# Patient Record
Sex: Male | Born: 1995 | Race: White | Hispanic: No | Marital: Single | Smoking: Current every day smoker
Health system: Southern US, Community
[De-identification: ages and names within clinical notes are randomized; demographics above are authoritative.]

## PROBLEM LIST (undated history)

## (undated) DIAGNOSIS — F909 Attention-deficit hyperactivity disorder, unspecified type: Secondary | ICD-10-CM

## (undated) HISTORY — DX: Attention-deficit hyperactivity disorder, unspecified type: F90.9

---
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
## 2014-02-02 ENCOUNTER — Emergency Department: Payer: Self-pay | Admitting: Emergency Medicine

## 2014-02-02 LAB — BASIC METABOLIC PANEL
Anion Gap: 7 (ref 7–16)
BUN: 11 mg/dL (ref 7–18)
CALCIUM: 8.8 mg/dL (ref 8.5–10.1)
CREATININE: 1.17 mg/dL (ref 0.60–1.30)
Chloride: 106 mmol/L (ref 98–107)
Co2: 28 mmol/L (ref 21–32)
Glucose: 84 mg/dL (ref 65–99)
Osmolality: 280 (ref 275–301)
POTASSIUM: 3.6 mmol/L (ref 3.5–5.1)
Sodium: 141 mmol/L (ref 136–145)

## 2014-02-02 LAB — CBC
HCT: 44.3 % (ref 40.0–52.0)
HGB: 15.1 g/dL (ref 13.0–18.0)
MCH: 30.2 pg (ref 26.0–34.0)
MCHC: 34 g/dL (ref 32.0–36.0)
MCV: 89 fL (ref 80–100)
Platelet: 325 10*3/uL (ref 150–440)
RBC: 4.99 10*6/uL (ref 4.40–5.90)
RDW: 12.1 % (ref 11.5–14.5)
WBC: 16.1 10*3/uL — ABNORMAL HIGH (ref 3.8–10.6)

## 2014-02-02 LAB — TROPONIN I

## 2014-12-18 ENCOUNTER — Emergency Department
Admission: EM | Admit: 2014-12-18 | Discharge: 2014-12-18 | Disposition: A | Discharge status: Home / Self Care | Payer: Self-pay | Attending: Emergency Medicine | Admitting: Emergency Medicine

## 2014-12-18 ENCOUNTER — Encounter: Payer: Self-pay | Admitting: Emergency Medicine

## 2014-12-18 DIAGNOSIS — Z720 Tobacco use: Secondary | ICD-10-CM | POA: Insufficient documentation

## 2014-12-18 DIAGNOSIS — L089 Local infection of the skin and subcutaneous tissue, unspecified: Secondary | ICD-10-CM | POA: Insufficient documentation

## 2014-12-18 DIAGNOSIS — B9689 Other specified bacterial agents as the cause of diseases classified elsewhere: Secondary | ICD-10-CM

## 2014-12-18 MED ORDER — TRAMADOL HCL 50 MG PO TABS: 50.0000 mg | ORAL_TABLET | Freq: Four times a day (QID) | ORAL | Status: DC | PRN

## 2014-12-18 MED ORDER — IBUPROFEN 800 MG PO TABS
800.0000 mg | ORAL_TABLET | Freq: Once | ORAL | Status: AC
  Administered 2014-12-18: 800 mg via ORAL
  Filled 2014-12-18: qty 1

## 2014-12-18 MED ORDER — SULFAMETHOXAZOLE-TRIMETHOPRIM 800-160 MG PO TABS: 1.0000 | ORAL_TABLET | Freq: Two times a day (BID) | ORAL | Status: DC

## 2014-12-18 MED ORDER — TRAMADOL HCL 50 MG PO TABS
50.0000 mg | ORAL_TABLET | Freq: Once | ORAL | Status: AC
  Administered 2014-12-18: 50 mg via ORAL
  Filled 2014-12-18: qty 1

## 2014-12-18 MED ORDER — IBUPROFEN 800 MG PO TABS: 800.0000 mg | ORAL_TABLET | Freq: Three times a day (TID) | ORAL | Status: DC | PRN

## 2014-12-18 MED ORDER — SULFAMETHOXAZOLE-TRIMETHOPRIM 800-160 MG PO TABS
1.0000 | ORAL_TABLET | Freq: Once | ORAL | Status: AC
  Administered 2014-12-18: 1 via ORAL
  Filled 2014-12-18: qty 1

## 2014-12-18 NOTE — ED Notes (Signed)
Small abcess on lefrt cheeek

## 2014-12-18 NOTE — ED Provider Notes (Signed)
[facility] Emergency Department Provider Note  ____________________________________________  Time seen: Approximately 1:42 PM  I have reviewed the triage vital signs and the nursing notes.   HISTORY  Chief Complaint Abscess    HPI Joel AlkenRandy K Cadena Jr. is a 19 y.o. male left facial pain and edema secondary to an infection. Patient stated week ago he was working on the roof see his a lot of scratches on his face from tree limbs. Patient states noticed swelling and redness and thought his acne was flaring. Patient tried to express material from the lesion which made it worse 2 days ago. Patient rates his pain as a 5/10. Patient denies any fever with this complaint.  History reviewed. No pertinent past medical history.  There are no active problems to display for this patient.   History reviewed. No pertinent past surgical history.  No current outpatient prescriptions on file.  Allergies Review of patient's allergies indicates no known allergies.  No family history on file.  Social History History  Substance Use Topics  . Smoking status: Current Every Day Smoker  . Smokeless tobacco: Not on file  . Alcohol Use: No    Review of Systems Constitutional: No fever/chills Eyes: No visual changes. ENT: No sore throat. Cardiovascular: Denies chest pain. Respiratory: Denies shortness of breath. Gastrointestinal: No abdominal pain.  No nausea, no vomiting.  No diarrhea.  No constipation. Genitourinary: Negative for dysuria. Musculoskeletal: Negative for back pain. Skin: Redness and swelling to the left lateral maxillary area. Neurological: Negative for headaches, focal weakness or numbness. 10-point ROS otherwise negative.  ____________________________________________   PHYSICAL EXAM:  VITAL SIGNS: ED Triage Vitals  Enc Vitals Group     BP 12/18/14 1326 115/75 mmHg     Pulse Rate 12/18/14 1326 86     Resp 12/18/14 1326 20     Temp 12/18/14  1326 98.2 F (36.8 C)     Temp Source 12/18/14 1326 Oral     SpO2 12/18/14 1326 98 %     Weight 12/18/14 1326 150 lb (68.04 kg)     Height 12/18/14 1326 5\' 7"  (1.702 m)     Head Cir --      Peak Flow --      Pain Score 12/18/14 1326 5     Pain Loc --      Pain Edu? --      Excl. in GC? --     Constitutional: Alert and oriented. Well appearing and in no acute distress. Eyes: Conjunctivae are normal. PERRL. EOMI. Head: Atraumatic. Nose: No congestion/rhinnorhea. Mouth/Throat: Mucous membranes are moist.  Oropharynx non-erythematous. Neck: No stridor. No cervical spine tenderness to palpation. Hematological/Lymphatic/Immunilogical: No cervical lymphadenopathy. Cardiovascular: Normal rate, regular rhythm. Grossly normal heart sounds.  Good peripheral circulation. Respiratory: Normal respiratory effort.  No retractions. Lungs CTAB. Gastrointestinal: Soft and nontender. No distention. No abdominal bruits. No CVA tenderness. Musculoskeletal: No lower extremity tenderness nor edema.  No joint effusions. Neurologic:  Normal speech and language. No gross focal neurologic deficits are appreciated. No gait instability. Skin:  Erythematous any tenderness nodule lesion left lateral mandible area. Lesion is nonfluctuant. Psychiatric: Mood and affect are normal. Speech and behavior are normal.  ____________________________________________   LABS (all labs ordered are listed, but only abnormal results are displayed)  Labs Reviewed - No data to display ____________________________________________  EKG   ____________________________________________  RADIOLOGY   ____________________________________________   PROCEDURES  Procedure(s) performed: None  Critical Care performed: No  ____________________________________________   INITIAL IMPRESSION /  ASSESSMENT AND PLAN / ED COURSE  Pertinent labs & imaging results that were available during my care of the patient were reviewed by me  and considered in my medical decision making (see chart for details).  Skin infection. Advised patient not to try to express material from the area. Patient l prescribe Bactrim, ibuprofen, and tramadol. Patient advised to apply warm compresses to the area twice daily as directed. Patient advised follow-up with "clinic and return to ER if his condition worsens. ____________________________________________   FINAL CLINICAL IMPRESSION(S) / ED DIAGNOSES  Final diagnoses:  Localized bacterial skin infection      Joni Reining, PA-C 12/18/14 1355  Jene Every, MD 12/18/14 [phone]

## 2014-12-18 NOTE — ED Notes (Signed)
Possible abscess area to face and some dental pain

## 2016-02-17 ENCOUNTER — Emergency Department
Admission: EM | Admit: 2016-02-17 | Discharge: 2016-02-17 | Disposition: A | Discharge status: Home / Self Care | Payer: Self-pay | Attending: Emergency Medicine | Admitting: Emergency Medicine

## 2016-02-17 DIAGNOSIS — F17200 Nicotine dependence, unspecified, uncomplicated: Secondary | ICD-10-CM | POA: Insufficient documentation

## 2016-02-17 DIAGNOSIS — K0889 Other specified disorders of teeth and supporting structures: Secondary | ICD-10-CM

## 2016-02-17 DIAGNOSIS — Z792 Long term (current) use of antibiotics: Secondary | ICD-10-CM | POA: Insufficient documentation

## 2016-02-17 DIAGNOSIS — K029 Dental caries, unspecified: Secondary | ICD-10-CM | POA: Insufficient documentation

## 2016-02-17 DIAGNOSIS — Z791 Long term (current) use of non-steroidal anti-inflammatories (NSAID): Secondary | ICD-10-CM | POA: Insufficient documentation

## 2016-02-17 MED ORDER — OXYCODONE-ACETAMINOPHEN 5-325 MG PO TABS
1.0000 | ORAL_TABLET | Freq: Once | ORAL | Status: AC
  Administered 2016-02-17: 1 via ORAL
  Filled 2016-02-17: qty 1

## 2016-02-17 MED ORDER — PENICILLIN V POTASSIUM 500 MG PO TABS: 500.0000 mg | ORAL_TABLET | Freq: Four times a day (QID) | ORAL | 0 refills | Status: DC

## 2016-02-17 MED ORDER — PENICILLIN V POTASSIUM 500 MG PO TABS
ORAL_TABLET | ORAL | Status: AC
  Administered 2016-02-17: 500 mg via ORAL
  Filled 2016-02-17: qty 1

## 2016-02-17 MED ORDER — PENICILLIN V POTASSIUM 500 MG PO TABS
500.0000 mg | ORAL_TABLET | Freq: Once | ORAL | Status: AC
  Administered 2016-02-17: 500 mg via ORAL

## 2016-02-17 MED ORDER — OXYCODONE-ACETAMINOPHEN 5-325 MG PO TABS: 1.0000 | ORAL_TABLET | Freq: Four times a day (QID) | ORAL | 0 refills | Status: DC | PRN

## 2016-02-17 MED ORDER — PENICILLIN V POTASSIUM 500 MG PO TABS
ORAL_TABLET | ORAL | Status: AC
  Filled 2016-02-17: qty 1

## 2016-02-17 NOTE — ED Triage Notes (Signed)
Pt states that he has been having pain in his mouth for the past 2.5 days

## 2016-02-17 NOTE — ED Provider Notes (Signed)
[facility] Emergency Department Provider Note  ____________________________________________   I have reviewed the triage vital signs and the nursing notes.   HISTORY  Chief Complaint Dental Pain    HPI Joel Floyd. is a 20 y.o. male presents today complaining of dental pain on the right upper. Patient has multiple decayed teeth. No fever no swelling. Pain for 2 days. Has had similar in the past. Has been told he needs to have his teeth pulled. Does not have insurance. No inciting injury.    No past medical history on file.  There are no active problems to display for this patient.   No past surgical history on file.  Prior to Admission medications   Medication Sig Start Date End Date Taking? Authorizing Provider  ibuprofen (ADVIL,MOTRIN) 800 MG tablet Take 1 tablet (800 mg total) by mouth every 8 (eight) hours as needed for moderate pain. 12/18/14   Joni Reining, PA-C  oxyCODONE-acetaminophen (ROXICET) 5-325 MG tablet Take 1 tablet by mouth every 6 (six) hours as needed. 02/17/16   Jeanmarie Plant, MD  penicillin v potassium (VEETID) 500 MG tablet Take 1 tablet (500 mg total) by mouth 4 (four) times daily. 02/17/16   Jeanmarie Plant, MD  sulfamethoxazole-trimethoprim (BACTRIM DS,SEPTRA DS) 800-160 MG per tablet Take 1 tablet by mouth 2 (two) times daily. 12/18/14   Joni Reining, PA-C  traMADol (ULTRAM) 50 MG tablet Take 1 tablet (50 mg total) by mouth every 6 (six) hours as needed for moderate pain. 12/18/14   Joni Reining, PA-C    Allergies Review of patient's allergies indicates no known allergies.  No family history on file.  Social History Social History  Substance Use Topics  . Smoking status: Current Every Day Smoker  . Smokeless tobacco: Not on file  . Alcohol use No    Review of Systems  ____________________________________________   PHYSICAL EXAM:  VITAL SIGNS: ED Triage Vitals  Enc Vitals Group     BP 02/17/16  2039 129/84     Pulse Rate 02/17/16 2039 84     Resp 02/17/16 2039 18     Temp 02/17/16 2039 98.7 F (37.1 C)     Temp Source 02/17/16 2039 Oral     SpO2 02/17/16 2039 100 %     Weight 02/17/16 2040 130 lb (59 kg)     Height 02/17/16 2040 5\' 7"  (1.702 m)     Head Circumference --      Peak Flow --      Pain Score 02/17/16 2040 8     Pain Loc --      Pain Edu? --      Excl. in GC? --     Constitutional: Alert and oriented. Well appearing and in no acute distress. Eyes: Conjunctivae are normal. PERRL. EOMI. Head: Atraumatic. Nose: No congestion/rhinnorhea. Mouth/Throat: Mucous membranes are moist.  Oropharynx non-erythematous., Multiple decayed teeth noted in the right upper jaw. No evidence of abscess not swollen or erythematous. There is some tenderness to palpation above the gumline however. No fluctuance. No evidence of ludwigs angina. Oropharynx is clear with no significant swelling no stridor no posterior pharyngeal swelling, there is no cervical lymphadenopathy.  Skin:  Skin is warm, dry and intact. No rash noted. Psychiatric: Mood and affect are normal. Speech and behavior are normal.  ____________________________________________   LABS (all labs ordered are listed, but only abnormal results are displayed)  Labs Reviewed - No data to display ____________________________________________  EKG  I personally interpreted any EKGs ordered by me or triage  ____________________________________________  RADIOLOGY  I reviewed any imaging ordered by me or triage that were performed during my shift and, if possible, patient and/or family made aware of any abnormal findings. ____________________________________________   PROCEDURES  Procedure(s) performed: None  Procedures  Critical Care performed: None  ____________________________________________   INITIAL IMPRESSION / ASSESSMENT AND PLAN / ED COURSE  Pertinent labs & imaging results that were available during my  care of the patient were reviewed by me and considered in my medical decision making (see chart for details).  Patient with dental pain, because he has some gumline pain I'll also give him penicillin. He is not allergic to state. The given a short course of pain medication but I strongly advised that this is not appropriate venue for ongoing dental issues and that he needs to follow-up with the dental clinic and I have given him information about that.  Clinical Course   ____________________________________________   FINAL CLINICAL IMPRESSION(S) / ED DIAGNOSES  Final diagnoses:  Pain, dental  Pain due to dental caries      This chart was dictated using voice recognition software.  Despite best efforts to proofread,  errors can occur which can change meaning.      Jeanmarie PlantJames A Junetta Hearn, MD 02/17/16 2100

## 2016-11-05 ENCOUNTER — Emergency Department: Payer: Self-pay

## 2016-11-05 ENCOUNTER — Emergency Department
Admission: EM | Admit: 2016-11-05 | Discharge: 2016-11-05 | Disposition: A | Discharge status: Home / Self Care | Payer: Self-pay | Attending: Emergency Medicine | Admitting: Emergency Medicine

## 2016-11-05 ENCOUNTER — Encounter: Payer: Self-pay | Admitting: Emergency Medicine

## 2016-11-05 DIAGNOSIS — Z791 Long term (current) use of non-steroidal anti-inflammatories (NSAID): Secondary | ICD-10-CM | POA: Insufficient documentation

## 2016-11-05 DIAGNOSIS — Z79899 Other long term (current) drug therapy: Secondary | ICD-10-CM | POA: Insufficient documentation

## 2016-11-05 DIAGNOSIS — J029 Acute pharyngitis, unspecified: Secondary | ICD-10-CM | POA: Insufficient documentation

## 2016-11-05 DIAGNOSIS — F17200 Nicotine dependence, unspecified, uncomplicated: Secondary | ICD-10-CM | POA: Insufficient documentation

## 2016-11-05 DIAGNOSIS — J209 Acute bronchitis, unspecified: Secondary | ICD-10-CM | POA: Insufficient documentation

## 2016-11-05 MED ORDER — AZITHROMYCIN 250 MG PO TABS: ORAL_TABLET | ORAL | 0 refills | Status: AC

## 2016-11-05 MED ORDER — BENZONATATE 100 MG PO CAPS: 100.0000 mg | ORAL_CAPSULE | Freq: Three times a day (TID) | ORAL | 0 refills | Status: DC | PRN

## 2016-11-05 MED ORDER — ALBUTEROL SULFATE HFA 108 (90 BASE) MCG/ACT IN AERS: 2.0000 | INHALATION_SPRAY | Freq: Four times a day (QID) | RESPIRATORY_TRACT | 0 refills | Status: DC | PRN

## 2016-11-05 MED ORDER — ACETAMINOPHEN 325 MG PO TABS
650.0000 mg | ORAL_TABLET | Freq: Once | ORAL | Status: AC | PRN
  Administered 2016-11-05: 650 mg via ORAL
  Filled 2016-11-05: qty 2

## 2016-11-05 MED ORDER — AZITHROMYCIN 500 MG PO TABS
500.0000 mg | ORAL_TABLET | Freq: Once | ORAL | Status: AC
  Administered 2016-11-05: 500 mg via ORAL
  Filled 2016-11-05: qty 1

## 2016-11-05 MED ORDER — HYDROCOD POLST-CPM POLST ER 10-8 MG/5ML PO SUER
5.0000 mL | Freq: Once | ORAL | Status: AC
  Administered 2016-11-05: 5 mL via ORAL
  Filled 2016-11-05: qty 5

## 2016-11-05 NOTE — ED Notes (Signed)
Pt returned from XR at this time. 

## 2016-11-05 NOTE — ED Triage Notes (Signed)
Patient ambulatory to triage with steady gait, without difficulty or distress noted; pt reports awoke Sunday with general HA, earache, sore throat, congestion, prod cough green/yellow sputum

## 2016-11-05 NOTE — ED Notes (Signed)
PT to XR at this time.

## 2016-11-05 NOTE — ED Notes (Signed)

## 2016-11-05 NOTE — ED Provider Notes (Signed)
[facility] Emergency Department Provider Note    First MD Initiated Contact with Patient 11/05/16 0503     (approximate)  I have reviewed the triage vital signs and the nursing notes.   HISTORY  Chief Complaint Headache and Nasal Congestion   HPI Joel AlkenRandy K Whiston Jr. is a 21 y.o. male with no chronic past medical conditions presents to the emergency department with 2 day history of generalized headache bilateral earaches sore throat nasal congestion and productive cough with greenish yellow sputum. Patient afebrile on presentation with temperature 100.5. Patient admits to daily tobacco use one pack of cigarettes per day.   Past medical history None There are no active problems to display for this patient.   Past surgical history None  Prior to Admission medications   Medication Sig Start Date End Date Taking? Authorizing Provider  albuterol (PROVENTIL HFA;VENTOLIN HFA) 108 (90 Base) MCG/ACT inhaler Inhale 2 puffs into the lungs every 6 (six) hours as needed for wheezing or shortness of breath. 11/05/16   Darci CurrentBrown, [place] N, MD  azithromycin (ZITHROMAX Z-PAK) 250 MG tablet Take 2 tablets (500 mg) on  Day 1,  followed by 1 tablet (250 mg) once daily on Days 2 through 5. 11/05/16 11/10/16  Darci CurrentBrown, Guy N, MD  benzonatate (TESSALON PERLES) 100 MG capsule Take 1 capsule (100 mg total) by mouth 3 (three) times daily as needed for cough. 11/05/16   Darci CurrentBrown, [place] N, MD  ibuprofen (ADVIL,MOTRIN) 800 MG tablet Take 1 tablet (800 mg total) by mouth every 8 (eight) hours as needed for moderate pain. 12/18/14   Joni ReiningSmith, Ronald K, PA-C  oxyCODONE-acetaminophen (ROXICET) 5-325 MG tablet Take 1 tablet by mouth every 6 (six) hours as needed. 02/17/16   Jeanmarie PlantMcShane, James A, MD  penicillin v potassium (VEETID) 500 MG tablet Take 1 tablet (500 mg total) by mouth 4 (four) times daily. 02/17/16   Jeanmarie PlantMcShane, James A, MD  sulfamethoxazole-trimethoprim (BACTRIM DS,SEPTRA DS) 800-160 MG per  tablet Take 1 tablet by mouth 2 (two) times daily. 12/18/14   Joni ReiningSmith, Ronald K, PA-C  traMADol (ULTRAM) 50 MG tablet Take 1 tablet (50 mg total) by mouth every 6 (six) hours as needed for moderate pain. 12/18/14   Joni ReiningSmith, Ronald K, PA-C    Allergies No known drug allergies No family history on file.  Social History Social History  Substance Use Topics  . Smoking status: Current Every Day Smoker  . Smokeless tobacco: Never Used  . Alcohol use No    Review of Systems Constitutional: Positive for fever/chills Eyes: No visual changes. ENT: Positive for sore throat. Cardiovascular: Denies chest pain. Respiratory: Denies shortness of breath.Positive productive cough Gastrointestinal: No abdominal pain.  No nausea, no vomiting.  No diarrhea.  No constipation. Genitourinary: Negative for dysuria. Musculoskeletal: Negative for neck pain.  Negative for back pain. Integumentary: Negative for rash. Neurological: Positive for headaches, negative for focal weakness or numbness.   ____________________________________________   PHYSICAL EXAM:  VITAL SIGNS: ED Triage Vitals  Enc Vitals Group     BP 11/05/16 0446 119/74     Pulse Rate 11/05/16 0446 92     Resp 11/05/16 0446 18     Temp 11/05/16 0446 (!) 100.5 F (38.1 C)     Temp Source 11/05/16 0446 Oral     SpO2 11/05/16 0446 99 %     Weight 11/05/16 0444 58.5 kg (129 lb)     Height 11/05/16 0444 1.702 m (5\' 7" )     Head Circumference --  Peak Flow --      Pain Score 11/05/16 0444 6     Pain Loc --      Pain Edu? --      Excl. in GC? --     Constitutional: Alert and oriented. Well appearing and in no acute distress. Eyes: Conjunctivae are normal.  Head: Atraumatic. Ears:  Healthy appearing ear canals and TMs bilaterally Nose: No congestion/rhinnorhea. Mouth/Throat: Mucous membranes are moist.  Oropharynx non-erythematous. Neck: No stridor.  No meningeal signs.   Cardiovascular: Normal rate, regular rhythm. Good peripheral  circulation. Grossly normal heart sounds. Respiratory: Normal respiratory effort.  No retractions. Lungs CTAB. Gastrointestinal: Soft and nontender. No distention.  Musculoskeletal: No lower extremity tenderness nor edema. No gross deformities of extremities. Neurologic:  Normal speech and language. No gross focal neurologic deficits are appreciated.  Skin:  Skin is warm, dry and intact. No rash noted. Psychiatric: Mood and affect are normal. Speech and behavior are normal.   RADIOLOGY I, [place] N BROWN, personally viewed and evaluated these images (plain radiographs) as part of my medical decision making, as well as reviewing the written report by the radiologist.  Dg Chest 2 View  Result Date: 11/05/2016 CLINICAL DATA:  Productive cough.  Fever. EXAM: CHEST  2 VIEW COMPARISON:  02/02/2014 FINDINGS: The cardiomediastinal contours are normal. The lungs are clear. Pulmonary vasculature is normal. No consolidation, pleural effusion, or pneumothorax. No acute osseous abnormalities are seen. IMPRESSION: No acute abnormality. Electronically Signed   By: Rubye Oaks M.D.   On: 11/05/2016 05:52      Procedures   ____________________________________________   INITIAL IMPRESSION / ASSESSMENT AND PLAN / ED COURSE  Pertinent labs & imaging results that were available during my care of the patient were reviewed by me and considered in my medical decision making (see chart for details).  21 year old male presenting with symptoms consistent with acute bronchitis. Chest x-ray revealed no evidence of pneumonia. She was a daily one pack per day cigarette smoker and a such concern for possible bacterial etiology. Patient given azithromycin and Tessalon and albuterol inhaler for home      ____________________________________________  FINAL CLINICAL IMPRESSION(S) / ED DIAGNOSES  Final diagnoses:  Acute bronchitis, unspecified organism     MEDICATIONS GIVEN DURING THIS  VISIT:  Medications  acetaminophen (TYLENOL) tablet 650 mg (650 mg Oral Given 11/05/16 0449)  chlorpheniramine-HYDROcodone (TUSSIONEX) 10-8 MG/5ML suspension 5 mL (5 mLs Oral Given 11/05/16 0537)  azithromycin (ZITHROMAX) tablet 500 mg (500 mg Oral Given 11/05/16 0537)     NEW OUTPATIENT MEDICATIONS STARTED DURING THIS VISIT:  Discharge Medication List as of 11/05/2016  6:16 AM    START taking these medications   Details  albuterol (PROVENTIL HFA;VENTOLIN HFA) 108 (90 Base) MCG/ACT inhaler Inhale 2 puffs into the lungs every 6 (six) hours as needed for wheezing or shortness of breath., Starting Wed 11/05/2016, Print    azithromycin (ZITHROMAX Z-PAK) 250 MG tablet Take 2 tablets (500 mg) on  Day 1,  followed by 1 tablet (250 mg) once daily on Days 2 through 5., Print    benzonatate (TESSALON PERLES) 100 MG capsule Take 1 capsule (100 mg total) by mouth 3 (three) times daily as needed for cough., Starting Wed 11/05/2016, Print        Discharge Medication List as of 11/05/2016  6:16 AM      Discharge Medication List as of 11/05/2016  6:16 AM       Note:  This document was prepared using Dragon  voice recognition software and may include unintentional dictation errors.    Darci Current, MD 11/05/16 2227

## 2016-11-08 ENCOUNTER — Encounter: Payer: Self-pay | Admitting: Emergency Medicine

## 2016-11-08 ENCOUNTER — Emergency Department
Admission: EM | Admit: 2016-11-08 | Discharge: 2016-11-08 | Disposition: A | Discharge status: Home / Self Care | Payer: No Typology Code available for payment source | Attending: Emergency Medicine | Admitting: Emergency Medicine

## 2016-11-08 DIAGNOSIS — B9789 Other viral agents as the cause of diseases classified elsewhere: Secondary | ICD-10-CM | POA: Diagnosis not present

## 2016-11-08 DIAGNOSIS — Z79899 Other long term (current) drug therapy: Secondary | ICD-10-CM | POA: Diagnosis not present

## 2016-11-08 DIAGNOSIS — J069 Acute upper respiratory infection, unspecified: Secondary | ICD-10-CM | POA: Diagnosis not present

## 2016-11-08 DIAGNOSIS — F17200 Nicotine dependence, unspecified, uncomplicated: Secondary | ICD-10-CM | POA: Insufficient documentation

## 2016-11-08 DIAGNOSIS — R05 Cough: Secondary | ICD-10-CM | POA: Diagnosis present

## 2016-11-08 MED ORDER — IPRATROPIUM-ALBUTEROL 0.5-2.5 (3) MG/3ML IN SOLN
3.0000 mL | Freq: Once | RESPIRATORY_TRACT | Status: AC
  Administered 2016-11-08: 3 mL via RESPIRATORY_TRACT
  Filled 2016-11-08: qty 3

## 2016-11-08 MED ORDER — PREDNISONE 10 MG (21) PO TBPK: ORAL_TABLET | ORAL | 0 refills | Status: DC

## 2016-11-08 MED ORDER — SODIUM CHLORIDE 0.9 % IV BOLUS (SEPSIS)
1000.0000 mL | Freq: Once | INTRAVENOUS | Status: AC
  Administered 2016-11-08: 1000 mL via INTRAVENOUS

## 2016-11-08 NOTE — ED Provider Notes (Signed)
[facility] Emergency Department Provider Note  ____________________________________________  Time seen: Approximately 10:49 AM  I have reviewed the triage vital signs and the nursing notes.   HISTORY  Chief Complaint Cough    HPI Joel Floyd. is a 21 y.o. male that presents to the emergency department with 5 days of generalized body aches, nasal congestion, and productive cough with green sputum. His chest hurts when he coughs. Patient states that he has not eaten or had anything to drink today. He was seen in the emergency department 3 days ago for the same symptoms. Symptoms have not gotten better or worse. He has been using the inhaler he was prescribed but did not read the label so was using it more often than prescribed. He thought he was overdosing on his inhaler so he quit using it yesterday. He has been taking the [employer] and the antibiotics he was prescribed in the ED without relief. He smokes a pack of cigarettes per day. He denies fever, SOB, vomiting, abdominal pain.   History reviewed. No pertinent past medical history.  There are no active problems to display for this patient.   History reviewed. No pertinent surgical history.  Prior to Admission medications   Medication Sig Start Date End Date Taking? Authorizing Provider  albuterol (PROVENTIL HFA;VENTOLIN HFA) 108 (90 Base) MCG/ACT inhaler Inhale 2 puffs into the lungs every 6 (six) hours as needed for wheezing or shortness of breath. 11/05/16   Darci Current, MD  azithromycin (ZITHROMAX Z-PAK) 250 MG tablet Take 2 tablets (500 mg) on  Day 1,  followed by 1 tablet (250 mg) once daily on Days 2 through 5. 11/05/16 11/10/16  Darci Current, MD  benzonatate (TESSALON PERLES) 100 MG capsule Take 1 capsule (100 mg total) by mouth 3 (three) times daily as needed for cough. 11/05/16   Darci Current, MD  ibuprofen (ADVIL,MOTRIN) 800 MG tablet Take 1 tablet (800 mg total) by mouth  every 8 (eight) hours as needed for moderate pain. 12/18/14   Joni Reining, PA-C  oxyCODONE-acetaminophen (ROXICET) 5-325 MG tablet Take 1 tablet by mouth every 6 (six) hours as needed. 02/17/16   Jeanmarie Plant, MD  penicillin v potassium (VEETID) 500 MG tablet Take 1 tablet (500 mg total) by mouth 4 (four) times daily. 02/17/16   Jeanmarie Plant, MD  predniSONE (STERAPRED UNI-PAK 21 TAB) 10 MG (21) TBPK tablet Take 6 tablets on day 1, take 5 tablets on day 2, take 4 tablets on day 3, take 3 tablets on day 4, take 2 tablets on day 5, take 1 tablet on day 6 11/08/16   Enid Derry, PA-C  sulfamethoxazole-trimethoprim (BACTRIM DS,SEPTRA DS) 800-160 MG per tablet Take 1 tablet by mouth 2 (two) times daily. 12/18/14   Joni Reining, PA-C  traMADol (ULTRAM) 50 MG tablet Take 1 tablet (50 mg total) by mouth every 6 (six) hours as needed for moderate pain. 12/18/14   Joni Reining, PA-C    Allergies Patient has no known allergies.  History reviewed. No pertinent family history.  Social History Social History  Substance Use Topics  . Smoking status: Current Every Day Smoker  . Smokeless tobacco: Never Used  . Alcohol use No     Review of Systems  Constitutional: No fever/chills Eyes: No visual changes. No discharge. ENT: Positive for congestion and rhinorrhea. Respiratory: Positive for cough. No SOB. Gastrointestinal: No abdominal pain.  No nausea, no vomiting.  No diarrhea.  No constipation. Skin: Negative for rash, abrasions, lacerations, ecchymosis. Neurological: Negative for headaches.   ____________________________________________   PHYSICAL EXAM:  VITAL SIGNS: ED Triage Vitals  Enc Vitals Group     BP 11/08/16 0950 124/66     Pulse Rate 11/08/16 0950 93     Resp 11/08/16 0950 18     Temp 11/08/16 0950 98.4 F (36.9 C)     Temp Source 11/08/16 0950 Oral     SpO2 11/08/16 0950 100 %     Weight 11/08/16 0950 129 lb (58.5 kg)     Height 11/08/16 0950 5\' 7"  (1.702 m)      Head Circumference --      Peak Flow --      Pain Score 11/08/16 0953 8     Pain Loc --      Pain Edu? --      Excl. in GC? --      Constitutional: Alert and oriented. Well appearing and in no acute distress. Eyes: Conjunctivae are normal. PERRL. EOMI. No discharge. Head: Atraumatic. ENT: No frontal and maxillary sinus tenderness.      Ears: Tympanic membranes pearly gray with good landmarks. No discharge.      Nose: Mild congestion/rhinnorhea.      Mouth/Throat: Mucous membranes are moist. Oropharynx non-erythematous. Tonsils not enlarged. No exudates. Uvula midline. Neck: No stridor.   Hematological/Lymphatic/Immunilogical: No cervical lymphadenopathy. Cardiovascular: Normal rate, regular rhythm.  Good peripheral circulation. Respiratory: Normal respiratory effort without tachypnea or retractions. Lungs CTAB. Good air entry to the bases with no decreased or absent breath sounds. Gastrointestinal: Bowel sounds 4 quadrants. Soft and nontender to palpation. No guarding or rigidity. No palpable masses. No distention. Musculoskeletal: Full range of motion to all extremities. No gross deformities appreciated. Neurologic:  Normal speech and language. No gross focal neurologic deficits are appreciated.  Skin:  Skin is warm, dry and intact. No rash noted.   ____________________________________________   LABS (all labs ordered are listed, but only abnormal results are displayed)  Labs Reviewed - No data to display ____________________________________________  EKG   ____________________________________________  RADIOLOGY  No results found.  ____________________________________________    PROCEDURES  Procedure(s) performed:    Procedures   Medications  sodium chloride 0.9 % bolus 1,000 mL (0 mLs Intravenous Stopped 11/08/16 1234)  ipratropium-albuterol (DUONEB) 0.5-2.5 (3) MG/3ML nebulizer solution 3 mL (3 mLs Nebulization Given 11/08/16 1112)      ____________________________________________   INITIAL IMPRESSION / ASSESSMENT AND PLAN / ED COURSE  Pertinent labs & imaging results that were available during my care of the patient were reviewed by me and considered in my medical decision making (see chart for details).  Review of the [place] CSRS was performed in accordance of the [facility] prior to dispensing any controlled drugs.   Patient's diagnosis is consistent with upper respiratory infection. Vital signs and exam are reassuring. He had a chest x-ray completed 2 days ago and did not show signs of pneumonia. He has been taking azithromycin daily for 3 days. I do not think there is need for a repeat chest xray at this time. Patient is in the room drinking Dr Reino KentPepper. Patient feels comfortable going home. He was given IV fluids. He felt better after DuoNeb. Patient will be discharged home with prescriptions for prednisone. Patient is to follow up with PCP as needed or otherwise directed. Patient is given ED precautions to return to the ED for any worsening or new symptoms.     ____________________________________________  FINAL  CLINICAL IMPRESSION(S) / ED DIAGNOSES  Final diagnoses:  Viral URI with cough      NEW MEDICATIONS STARTED DURING THIS VISIT:  Discharge Medication List as of 11/08/2016 12:27 PM    START taking these medications   Details  predniSONE (STERAPRED UNI-PAK 21 TAB) 10 MG (21) TBPK tablet Take 6 tablets on day 1, take 5 tablets on day 2, take 4 tablets on day 3, take 3 tablets on day 4, take 2 tablets on day 5, take 1 tablet on day 6, Print            This chart was dictated using voice recognition software/Dragon. Despite best efforts to proofread, errors can occur which can change the meaning. Any change was purely unintentional.    Enid Derry, PA-C 11/08/16 1624    Governor Rooks, MD 11/09/16 [phone]

## 2016-11-08 NOTE — ED Notes (Signed)
PA Ashley at bedside  

## 2016-11-08 NOTE — ED Triage Notes (Signed)
Pt to ed with c/o cough and congestion x several days.  Pt states was seen here about 2 days ago for same and has been taking meds as prescribed without relief.  Pt reports increased pain with cough.

## 2019-04-08 ENCOUNTER — Emergency Department: Payer: 59

## 2019-04-08 ENCOUNTER — Other Ambulatory Visit: Payer: Self-pay

## 2019-04-08 ENCOUNTER — Emergency Department
Admission: EM | Admit: 2019-04-08 | Discharge: 2019-04-08 | Disposition: A | Discharge status: Home / Self Care | Payer: 59 | Attending: Emergency Medicine | Admitting: Emergency Medicine

## 2019-04-08 DIAGNOSIS — Y990 Civilian activity done for income or pay: Secondary | ICD-10-CM | POA: Insufficient documentation

## 2019-04-08 DIAGNOSIS — X500XXA Overexertion from strenuous movement or load, initial encounter: Secondary | ICD-10-CM | POA: Diagnosis not present

## 2019-04-08 DIAGNOSIS — S3992XA Unspecified injury of lower back, initial encounter: Secondary | ICD-10-CM | POA: Diagnosis present

## 2019-04-08 DIAGNOSIS — Y9259 Other trade areas as the place of occurrence of the external cause: Secondary | ICD-10-CM | POA: Insufficient documentation

## 2019-04-08 DIAGNOSIS — S39012A Strain of muscle, fascia and tendon of lower back, initial encounter: Secondary | ICD-10-CM | POA: Diagnosis not present

## 2019-04-08 DIAGNOSIS — F17210 Nicotine dependence, cigarettes, uncomplicated: Secondary | ICD-10-CM | POA: Diagnosis not present

## 2019-04-08 DIAGNOSIS — Y9389 Activity, other specified: Secondary | ICD-10-CM | POA: Diagnosis not present

## 2019-04-08 MED ORDER — HYDROMORPHONE HCL 1 MG/ML IJ SOLN
1.0000 mg | Freq: Once | INTRAMUSCULAR | Status: AC
  Administered 2019-04-08: 1 mg via INTRAMUSCULAR
  Filled 2019-04-08: qty 1

## 2019-04-08 MED ORDER — ORPHENADRINE CITRATE 30 MG/ML IJ SOLN
60.0000 mg | Freq: Two times a day (BID) | INTRAMUSCULAR | Status: DC
  Administered 2019-04-08: 60 mg via INTRAMUSCULAR
  Filled 2019-04-08: qty 2

## 2019-04-08 MED ORDER — IBUPROFEN 600 MG PO TABS: 600.0000 mg | ORAL_TABLET | Freq: Three times a day (TID) | ORAL | 0 refills | Status: DC | PRN

## 2019-04-08 MED ORDER — TRAMADOL HCL 50 MG PO TABS: 50.0000 mg | ORAL_TABLET | Freq: Four times a day (QID) | ORAL | 0 refills | Status: DC | PRN

## 2019-04-08 MED ORDER — CYCLOBENZAPRINE HCL 10 MG PO TABS: 10.0000 mg | ORAL_TABLET | Freq: Three times a day (TID) | ORAL | 0 refills | Status: DC | PRN

## 2019-04-08 MED ORDER — ONDANSETRON 8 MG PO TBDP
8.0000 mg | ORAL_TABLET | Freq: Once | ORAL | Status: AC
  Administered 2019-04-08: 8 mg via ORAL
  Filled 2019-04-08: qty 1

## 2019-04-08 NOTE — ED Notes (Signed)
See triage note  Presents with lower back pain   States pain started after lifting heavy things at work  States pain is mainly at lower back  Non radiating    States he has used Lidoderm patches,ice ,and heat  Ambulates slowly to treatment room

## 2019-04-08 NOTE — ED Provider Notes (Signed)
[facility] Emergency Department Provider Note   ____________________________________________   First MD Initiated Contact with Patient 04/08/19 [phone]     (approximate)  I have reviewed the triage vital signs and the nursing notes.   HISTORY  Chief Complaint Back Pain    HPI Joel Floyd. is a 23 y.o. male patient complain of back pain for 3 days.  Patient states he has been performing repetitive heavy lifting for a week.  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  Patient denies radicular component to his pain.  Patient denies bladder or bowel dysfunction.  Patient no relief with over-the-counter anti-inflammatory medication and Lidoderm patches.  Patient also use a TENS unit.  Patient rates the pain as a 7/10.  Patient described the pain as achy".  Patient the pain increased with trying to stand erect.         No past medical history on file.  There are no active problems to display for this patient.   No past surgical history on file.  Prior to Admission medications   Medication Sig Start Date End Date Taking? Authorizing Provider  cyclobenzaprine (FLEXERIL) 10 MG tablet Take 1 tablet (10 mg total) by mouth 3 (three) times daily as needed. 04/08/19   Joni Reining, PA-C  ibuprofen (ADVIL) 600 MG tablet Take 1 tablet (600 mg total) by mouth every 8 (eight) hours as needed. 04/08/19   Joni Reining, PA-C  traMADol (ULTRAM) 50 MG tablet Take 1 tablet (50 mg total) by mouth every 6 (six) hours as needed for moderate pain. 04/08/19   Joni Reining, PA-C    Allergies Patient has no known allergies.  No family history on file.  Social History Social History   Tobacco Use  . Smoking status: Current Every Day Smoker  . Smokeless tobacco: Never Used  Substance Use Topics  . Alcohol use: No  . Drug use: No    Review of Systems Constitutional: No fever/chills Eyes: No  visual changes. ENT: No sore throat. Cardiovascular: Denies chest pain. Respiratory: Denies shortness of breath. Gastrointestinal: No abdominal pain.  No nausea, no vomiting.  No diarrhea.  No constipation. Genitourinary: Negative for dysuria. Musculoskeletal: Positive for back pain. Skin: Negative for rash. Neurological: Negative for headaches, focal weakness or numbness.   ____________________________________________   PHYSICAL EXAM:  VITAL SIGNS: ED Triage Vitals [04/08/19 0830]  Enc Vitals Group     BP (!) 143/88     Pulse Rate (!) 109     Resp 16     Temp 98.4 F (36.9 C)     Temp Source Oral     SpO2 100 %     Weight 129 lb (58.5 kg)     Height 5\' 8"  (1.727 m)     Head Circumference      Peak Flow      Pain Score 7     Pain Loc      Pain Edu?      Excl. in GC?    Constitutional: Alert and oriented. Well appearing and in no acute distress. Cardiovascular: Tachycardic, regular rhythm. Grossly normal heart sounds.  Good peripheral circulation. Respiratory: Normal respiratory effort.  No retractions. Lungs CTAB. Gastrointestinal: Soft and nontender. No distention. No abdominal bruits. No CVA tenderness. Musculoskeletal: Patient lumbar spine is in a slightly flexed position.  Patient has moderate guarding palpation of L3-S1.  Patient has right paraspinal muscle spasm with left  lateral movements.  No lower extremity tenderness nor edema.  No joint effusions. Neurologic:  Normal speech and language. No gross focal neurologic deficits are appreciated. No gait instability. Skin:  Skin is warm, dry and intact. No rash noted. Psychiatric: Mood and affect are normal. Speech and behavior are normal.  ____________________________________________   LABS (all labs ordered are listed, but only abnormal results are displayed)  Labs Reviewed - No data to display ____________________________________________  EKG   ____________________________________________  RADIOLOGY   ED MD interpretation:    Official radiology report(s): Dg Lumbar Spine 2-3 Views  Result Date: 04/08/2019 CLINICAL DATA:  Low back pain following an injury 2 days ago. EXAM: LUMBAR SPINE - 2-3 VIEW COMPARISON:  None. FINDINGS: Five non-rib-bearing lumbar vertebrae. Minimal levoconvex lumbar scoliosis. Unfused right L1 transverse process. No fractures, pars defects or subluxations. IMPRESSION: No acute abnormality. Electronically Signed   By: Claudie Revering M.D.   On: 04/08/2019 09:47    ____________________________________________   PROCEDURES  Procedure(s) performed (including Critical Care):  Procedures   ____________________________________________   INITIAL IMPRESSION / ASSESSMENT AND PLAN / ED COURSE  As part of my medical decision making, I reviewed the following data within the [place]. was evaluated in Emergency Department on 04/08/2019 for the symptoms described in the history of present illness. He was evaluated in the context of the global COVID-19 pandemic, which necessitated consideration that the patient might be at risk for infection with the SARS-CoV-2 virus that causes COVID-19. Institutional protocols and algorithms that pertain to the evaluation of patients at risk for COVID-19 are in a state of rapid change based on information released by regulatory bodies including the CDC and federal and state organizations. These policies and algorithms were followed during the patient's care in the ED.  Patient presents with increasing low back pain secondary to a few days of repetitive heavy lifting.  Discussed x-ray findings with patient.  Physical exam consistent with lumbar strain.  Patient given discharge care instruction work note.  Patient given prescription for tramadol, Flexeril, and ibuprofen.  Patient advised follow-up [facility] if condition persist.      ____________________________________________    FINAL CLINICAL IMPRESSION(S) / ED DIAGNOSES  Final diagnoses:  Strain of lumbar region, initial encounter     ED Discharge Orders         Ordered    traMADol (ULTRAM) 50 MG tablet  Every 6 hours PRN     04/08/19 0958    cyclobenzaprine (FLEXERIL) 10 MG tablet  3 times daily PRN     04/08/19 0958    ibuprofen (ADVIL) 600 MG tablet  Every 8 hours PRN     04/08/19 0958           Note:  This document was prepared using Dragon voice recognition software and may include unintentional dictation errors.    Sable Feil, PA-C 04/08/19 8841    Earleen Newport, MD 04/08/19 1030

## 2019-04-08 NOTE — ED Triage Notes (Signed)
Pt states that he injured his back working a few days ago, pt is ambulatory but appears strained to stand straight, states that he got some lidoderm patches, tens units, and heat and cold to help with pain and states he isn't getting any relief. Pt denies wanting to do worker's comp at this time

## 2019-07-23 ENCOUNTER — Emergency Department: Payer: Self-pay

## 2019-07-23 ENCOUNTER — Emergency Department
Admission: EM | Admit: 2019-07-23 | Discharge: 2019-07-23 | Disposition: A | Discharge status: Home / Self Care | Payer: Self-pay | Attending: Student in an Organized Health Care Education/Training Program | Admitting: Student in an Organized Health Care Education/Training Program

## 2019-07-23 ENCOUNTER — Other Ambulatory Visit: Payer: Self-pay

## 2019-07-23 ENCOUNTER — Encounter: Payer: Self-pay | Admitting: Emergency Medicine

## 2019-07-23 DIAGNOSIS — R300 Dysuria: Secondary | ICD-10-CM | POA: Insufficient documentation

## 2019-07-23 DIAGNOSIS — F17200 Nicotine dependence, unspecified, uncomplicated: Secondary | ICD-10-CM | POA: Insufficient documentation

## 2019-07-23 DIAGNOSIS — N433 Hydrocele, unspecified: Secondary | ICD-10-CM | POA: Insufficient documentation

## 2019-07-23 DIAGNOSIS — Z202 Contact with and (suspected) exposure to infections with a predominantly sexual mode of transmission: Secondary | ICD-10-CM | POA: Insufficient documentation

## 2019-07-23 DIAGNOSIS — N5089 Other specified disorders of the male genital organs: Secondary | ICD-10-CM

## 2019-07-23 LAB — CHLAMYDIA/NGC RT PCR (ARMC ONLY): Chlamydia Tr: NOT DETECTED

## 2019-07-23 LAB — CHLAMYDIA/NGC RT PCR (ARMC ONLY)??????????: N gonorrhoeae: NOT DETECTED

## 2019-07-23 MED ORDER — AZITHROMYCIN 500 MG PO TABS
1000.0000 mg | ORAL_TABLET | Freq: Once | ORAL | Status: AC
  Administered 2019-07-23: 1000 mg via ORAL
  Filled 2019-07-23: qty 2

## 2019-07-23 MED ORDER — CEFTRIAXONE SODIUM 250 MG IJ SOLR
250.0000 mg | Freq: Once | INTRAMUSCULAR | Status: AC
  Administered 2019-07-23: 250 mg via INTRAMUSCULAR
  Filled 2019-07-23: qty 250

## 2019-07-23 NOTE — ED Notes (Signed)
Pt provided UA cup and instructed to provide sample. Pt verbalizes understanding.

## 2019-07-23 NOTE — ED Notes (Signed)
Called lab to verify correct swab. Lab to send swab via tube system.

## 2019-07-23 NOTE — ED Provider Notes (Signed)
[facility] Emergency Department Provider Note ____________________________________________  Time seen: 5409  I have reviewed the triage vital signs and the nursing notes.  HISTORY  Chief Complaint  Exposure to STD   HPI Joel Floyd. is a 24 y.o. male presents to the ER with c/o exposure to STD. He reports his partner recently got diagnosed with gonorrhea. He does reports some dysuria but no penile discharge. He has right testicular swelling that has been present x 1 year, without pain. He also c/o a slight sore throat. He denies abdominal pain, nausea, or vomiting. He has not taken anything OTC for his symptoms.   History reviewed. No pertinent past medical history.  There are no problems to display for this patient.   History reviewed. No pertinent surgical history.  Prior to Admission medications   Not on File    Allergies Patient has no known allergies.  No family history on file.  Social History Social History   Tobacco Use  . Smoking status: Current Every Day Smoker  . Smokeless tobacco: Never Used  Substance Use Topics  . Alcohol use: No  . Drug use: No    Review of Systems  Constitutional: Negative for fever, chills or body aches. ENT: Positive for sore throat. Negative for runny nose, nasal congestion, ear pain or loss of taste/smell. Cardiovascular: Negative for chest pain or chest tightness. Respiratory: Negative for cough or shortness of breath. Gastrointestinal: Negative for abdominal pain, nausea or vomiting. Genitourinary: Positive for dysuria and testicular swelling. Negative for urgency, frequency or blood in the urine. Musculoskeletal: Negative for low back pain. Skin: Negative for rash or penile lesion.  ____________________________________________  PHYSICAL EXAM:  VITAL SIGNS: ED Triage Vitals  Enc Vitals Group     BP 07/23/19 1421 (!) 151/75     Pulse Rate 07/23/19 1421 (!) 110     Resp 07/23/19 1421  16     Temp 07/23/19 1421 98.8 F (37.1 C)     Temp Source 07/23/19 1421 Oral     SpO2 07/23/19 1421 99 %     Weight 07/23/19 1423 124 lb (56.2 kg)     Height 07/23/19 1423 5\' 8"  (1.727 m)     Head Circumference --      Peak Flow --      Pain Score 07/23/19 1433 0     Pain Loc --      Pain Edu? --      Excl. in [place]? --     Constitutional: Alert and oriented. Well appearing and in no distress. Head: Normocephalic and atraumatic. Eyes: Conjunctivae are normal. PERRL. Normal extraocular movements Ears: Canals clear. TMs intact bilaterally. Nose: No congestion/rhinorrhea/epistaxis. Mouth/Throat: Mucous membranes are moist. Neck: Supple. No thyromegaly. Hematological/Lymphatic/Immunological: No cervical lymphadenopathy. Cardiovascular: Normal rate, regular rhythm. Normal distal pulses. Respiratory: Normal respiratory effort. No wheezes/rales/rhonchi. Gastrointestinal: Soft and nontender. No distention. Musculoskeletal: Nontender with normal range of motion in all extremities.  Neurologic:  Normal gait without ataxia. Normal speech and language. No gross focal neurologic deficits are appreciated. Skin:  Skin is warm, dry and intact. No rash noted. Psychiatric: Mood and affect are normal. Patient exhibits appropriate insight and judgment. ____________________________________________   LABS    Lab Orders     Chlamydia/NGC rt PCR ([facility] only)     Gonococcus culture  ____________________________________________  RADIOLOGY   Imaging Orders     US SCROTUM W/DOPPLER IMPRESSION:  1. No testicular abnormality.  2. Normal blood flow to both testicles during  the examination.  3. Large right, small left hydroceles.    ____________________________________________   INITIAL IMPRESSION / ASSESSMENT AND PLAN / ED COURSE  Exposure to STD, Dysuria, Right Testicle Swelling secondary to Hydrocele:  Urine gonorrhea/chlamydia Will obtain throat culture as well US scrotum with arterial  and venous flow c/w hydrocele Rocephin 250 mg IM x 1 Azithromycin 1 gm PO x 1  ____________________________________________  FINAL CLINICAL IMPRESSION(S) / ED DIAGNOSES  Final diagnoses:  Testicular swelling, right  Exposure to STD  Dysuria  Hydrocele, acquired      Lorre Munroe, NP 07/23/19 1703    Sharman Cheek, MD 07/28/19 1537

## 2019-07-23 NOTE — ED Triage Notes (Signed)
Presents for STD exposure  States his wife tested positive for [facility]

## 2019-07-23 NOTE — Discharge Instructions (Addendum)
You were seen today for dysuria, exposure to STD and right testicle swelling. You have been treated for possible STD. Please avoid intercourse for 1 week. Your ultrasound shows you have excess fluid buildup in your right testicle. You did not want referral to a urologist at this time but you can follow up if the swelling increased or becomes painful.

## 2019-07-26 LAB — GONOCOCCUS CULTURE

## 2020-04-16 ENCOUNTER — Other Ambulatory Visit: Payer: Self-pay

## 2020-04-16 ENCOUNTER — Emergency Department: Payer: Self-pay

## 2020-04-16 ENCOUNTER — Emergency Department
Admission: EM | Admit: 2020-04-16 | Discharge: 2020-04-16 | Disposition: A | Discharge status: Home / Self Care | Payer: Self-pay | Attending: Emergency Medicine | Admitting: Emergency Medicine

## 2020-04-16 DIAGNOSIS — S29012A Strain of muscle and tendon of back wall of thorax, initial encounter: Secondary | ICD-10-CM | POA: Insufficient documentation

## 2020-04-16 DIAGNOSIS — Y999 Unspecified external cause status: Secondary | ICD-10-CM | POA: Insufficient documentation

## 2020-04-16 DIAGNOSIS — F17200 Nicotine dependence, unspecified, uncomplicated: Secondary | ICD-10-CM | POA: Insufficient documentation

## 2020-04-16 DIAGNOSIS — T148XXA Other injury of unspecified body region, initial encounter: Secondary | ICD-10-CM

## 2020-04-16 DIAGNOSIS — Y939 Activity, unspecified: Secondary | ICD-10-CM | POA: Insufficient documentation

## 2020-04-16 DIAGNOSIS — Y929 Unspecified place or not applicable: Secondary | ICD-10-CM | POA: Insufficient documentation

## 2020-04-16 DIAGNOSIS — X58XXXA Exposure to other specified factors, initial encounter: Secondary | ICD-10-CM | POA: Insufficient documentation

## 2020-04-16 DIAGNOSIS — R52 Pain, unspecified: Secondary | ICD-10-CM

## 2020-04-16 MED ORDER — IBUPROFEN 600 MG PO TABS: 600.0000 mg | ORAL_TABLET | Freq: Four times a day (QID) | ORAL | 0 refills | Status: AC | PRN

## 2020-04-16 MED ORDER — CYCLOBENZAPRINE HCL 5 MG PO TABS: ORAL_TABLET | ORAL | 0 refills | Status: AC

## 2020-04-16 NOTE — ED Notes (Signed)
Patient verbalizes understanding of discharge instructions. Opportunity for questioning and answers were provided. Armband removed by staff, pt discharged from ED. Ambulated out to lobby  

## 2020-04-16 NOTE — ED Triage Notes (Signed)
Pt here with left shoulder pain occurred 3 months ago. Pt stated that he was stretching this morning and felt something pop and the pain has been worse ever since. Pt A&O and NAD in triage.

## 2020-04-16 NOTE — ED Provider Notes (Signed)
[facility] Emergency Department Provider Note  ____________________________________________  Time seen: Approximately 11:43 AM  I have reviewed the triage vital signs and the nursing notes.   HISTORY  Chief Complaint Shoulder Pain    HPI Joel Floyd. is a 24 y.o. male that presents to the emergency department for evaluation of left upper back pain over his shoulder for 3 months, worsening for 2 days.  Patient states that 2 days ago he felt a pop to his back.  Pain increased significantly following the pop.  He has pain when he moves his left or right arms.  He is having difficulty lifting heavy objects due to the discomfort.  He can feel the pain in his back when he presses on his left chest wall.  Pain is worse when he takes a deep breath.  Pain does not radiate.  He suspects that he pulled a muscle.  No shortness of breath.  No past medical history on file.  There are no problems to display for this patient.   No past surgical history on file.  Prior to Admission medications   Medication Sig Start Date End Date Taking? Authorizing Provider  cyclobenzaprine (FLEXERIL) 5 MG tablet Take 1-2 tablets 3 times daily as needed 04/16/20   Enid Derry, PA-C  ibuprofen (ADVIL) 600 MG tablet Take 1 tablet (600 mg total) by mouth every 6 (six) hours as needed. 04/16/20   Enid Derry, PA-C    Allergies Patient has no known allergies.  No family history on file.  Social History Social History   Tobacco Use  . Smoking status: Current Every Day Smoker  . Smokeless tobacco: Never Used  Substance Use Topics  . Alcohol use: No  . Drug use: No     Review of Systems  Constitutional: No fever/chills Cardiovascular: No chest pain. Respiratory: No cough. No SOB. Gastrointestinal: No abdominal pain.  No nausea, no vomiting.  Musculoskeletal: Positive for back pain. Skin: Negative for rash, abrasions, lacerations, ecchymosis. Neurological:  Negative for headaches   ____________________________________________   PHYSICAL EXAM:  VITAL SIGNS: ED Triage Vitals  Enc Vitals Group     BP 04/16/20 0919 134/64     Pulse Rate 04/16/20 0919 68     Resp 04/16/20 0919 16     Temp 04/16/20 0919 98.7 F (37.1 C)     Temp Source 04/16/20 0919 Oral     SpO2 04/16/20 0919 100 %     Weight 04/16/20 0921 135 lb (61.2 kg)     Height 04/16/20 0921 5\' 8"  (1.727 m)     Head Circumference --      Peak Flow --      Pain Score 04/16/20 0927 10     Pain Loc --      Pain Edu? --      Excl. in GC? --      Constitutional: Alert and oriented. Well appearing and in no acute distress. Eyes: Conjunctivae are normal. PERRL. EOMI. Head: Atraumatic. ENT:      Ears:      Nose: No congestion/rhinnorhea.      Mouth/Throat: Mucous membranes are moist.  Neck: No stridor.   Cardiovascular: Normal rate, regular rhythm.  Good peripheral circulation. Respiratory: Normal respiratory effort without tachypnea or retractions. Lungs CTAB. Good air entry to the bases with no decreased or absent breath sounds. Gastrointestinal: Bowel sounds 4 quadrants. Soft and nontender to palpation. No guarding or rigidity. No palpable masses. No distention. Musculoskeletal: Full range of  motion to all extremities. No gross deformities appreciated.  Pinpoint tenderness to palpation to left mid scapula.  Pain elicited with range of motion of left shoulder and with resisted flexion and extension of left arm.  Pain worse with abduction of left shoulder. Neurologic:  Normal speech and language. No gross focal neurologic deficits are appreciated.  Skin:  Skin is warm, dry and intact. No rash noted. Psychiatric: Mood and affect are normal. Speech and behavior are normal. Patient exhibits appropriate insight and judgement.   ____________________________________________   LABS (all labs ordered are listed, but only abnormal results are displayed)  Labs Reviewed - No data to  display ____________________________________________  EKG   ____________________________________________  RADIOLOGY Joel Floyd, personally viewed and evaluated these images (plain radiographs) as part of my medical decision making, as well as reviewing the written report by the radiologist.  DG Shoulder Left Port  Result Date: 04/16/2020 CLINICAL DATA:  Left shoulder pain for 3 months EXAM: LEFT SHOULDER COMPARISON:  None. FINDINGS: There is no evidence of fracture or dislocation. There is no evidence of arthropathy or other focal bone abnormality. Soft tissues are unremarkable. IMPRESSION: Negative. Electronically Signed   By: Duanne Guess D.O.   On: 04/16/2020 09:51    ____________________________________________    PROCEDURES  Procedure(s) performed:    Procedures    Medications - No data to display   ____________________________________________   INITIAL IMPRESSION / ASSESSMENT AND PLAN / ED COURSE  Pertinent labs & imaging results that were available during my care of the patient were reviewed by me and considered in my medical decision making (see chart for details).  Review of the [place] CSRS was performed in accordance of the [facility] prior to dispensing any controlled drugs.    Patient presented to the emergency department for evaluation of left upper back pain.  Vital signs and exam are reassuring.  Symptoms are consistent with musculoskeletal injury.  Patient likely has a muscle strain or muscle tear.  Patient will be discharged home with prescriptions for Flexeril and Motrin. Patient is to follow up with primary care or orthopedics as directed. Patient is given ED precautions to return to the ED for any worsening or new symptoms.   Joel Alken. was evaluated in Emergency Department on 04/16/2020 for the symptoms described in the history of present illness. He was evaluated in the context of the global COVID-19 pandemic, which necessitated  consideration that the patient might be at risk for infection with the SARS-CoV-2 virus that causes COVID-19. Institutional protocols and algorithms that pertain to the evaluation of patients at risk for COVID-19 are in a state of rapid change based on information released by regulatory bodies including the CDC and federal and state organizations. These policies and algorithms were followed during the patient's care in the ED.  ____________________________________________  FINAL CLINICAL IMPRESSION(S) / ED DIAGNOSES  Final diagnoses:  Pain  Muscle strain      NEW MEDICATIONS STARTED DURING THIS VISIT:  ED Discharge Orders         Ordered    cyclobenzaprine (FLEXERIL) 5 MG tablet        04/16/20 1109    ibuprofen (ADVIL) 600 MG tablet  Every 6 hours PRN        04/16/20 1109              This chart was dictated using voice recognition software/Dragon. Despite best efforts to proofread, errors can occur which can change the meaning. Any change was  purely unintentional.    Enid Derry, PA-C 04/16/20 1504    Dionne Bucy, MD 04/16/20 1525

## 2021-04-09 ENCOUNTER — Other Ambulatory Visit: Payer: Self-pay

## 2021-04-09 ENCOUNTER — Emergency Department: Payer: BC Managed Care – PPO

## 2021-04-09 ENCOUNTER — Encounter: Payer: Self-pay | Admitting: Emergency Medicine

## 2021-04-09 ENCOUNTER — Emergency Department
Admission: EM | Admit: 2021-04-09 | Discharge: 2021-04-09 | Disposition: A | Discharge status: Home / Self Care | Payer: BC Managed Care – PPO | Attending: Emergency Medicine | Admitting: Emergency Medicine

## 2021-04-09 DIAGNOSIS — X509XXA Other and unspecified overexertion or strenuous movements or postures, initial encounter: Secondary | ICD-10-CM | POA: Insufficient documentation

## 2021-04-09 DIAGNOSIS — S99911A Unspecified injury of right ankle, initial encounter: Secondary | ICD-10-CM | POA: Diagnosis present

## 2021-04-09 DIAGNOSIS — F17210 Nicotine dependence, cigarettes, uncomplicated: Secondary | ICD-10-CM | POA: Diagnosis not present

## 2021-04-09 DIAGNOSIS — S93401A Sprain of unspecified ligament of right ankle, initial encounter: Secondary | ICD-10-CM | POA: Insufficient documentation

## 2021-04-09 LAB — CBC
HCT: 46.3 % (ref 39.0–52.0)
Hemoglobin: 15.4 g/dL (ref 13.0–17.0)
MCH: 30.4 pg (ref 26.0–34.0)
MCHC: 33.3 g/dL (ref 30.0–36.0)
MCV: 91.5 fL (ref 80.0–100.0)
Platelets: 348 10*3/uL (ref 150–400)
RBC: 5.06 MIL/uL (ref 4.22–5.81)
RDW: 11.6 % (ref 11.5–15.5)
WBC: 16 10*3/uL — ABNORMAL HIGH (ref 4.0–10.5)
nRBC: 0 % (ref 0.0–0.2)

## 2021-04-09 LAB — BASIC METABOLIC PANEL
Anion gap: 14 (ref 5–15)
BUN: 10 mg/dL (ref 6–20)
CO2: 22 mmol/L (ref 22–32)
Calcium: 9.6 mg/dL (ref 8.9–10.3)
Chloride: 102 mmol/L (ref 98–111)
Creatinine, Ser: 1.08 mg/dL (ref 0.61–1.24)
GFR, Estimated: >60 mL/min (ref >60)
Glucose, Bld: 93 mg/dL (ref 70–99)
Potassium: 3.2 mmol/L — ABNORMAL LOW (ref 3.5–5.1)
Sodium: 138 mmol/L (ref 135–145)

## 2021-04-09 MED ORDER — POTASSIUM CHLORIDE CRYS ER 20 MEQ PO TBCR
40.0000 meq | EXTENDED_RELEASE_TABLET | Freq: Once | ORAL | Status: AC
  Administered 2021-04-09: 40 meq via ORAL
  Filled 2021-04-09: qty 2

## 2021-04-09 MED ORDER — SODIUM CHLORIDE 0.9 % IV BOLUS: 1000.0000 mL | Freq: Once | INTRAVENOUS | Status: DC

## 2021-04-09 MED ORDER — SODIUM CHLORIDE 0.9 % IV BOLUS
500.0000 mL | Freq: Once | INTRAVENOUS | Status: AC
  Administered 2021-04-09: 500 mL via INTRAVENOUS

## 2021-04-09 MED ORDER — KETOROLAC TROMETHAMINE 30 MG/ML IJ SOLN
15.0000 mg | Freq: Once | INTRAMUSCULAR | Status: AC
  Administered 2021-04-09: 15 mg via INTRAVENOUS
  Filled 2021-04-09: qty 1

## 2021-04-09 NOTE — ED Notes (Addendum)
Ace wrap applied to pt right foot. Pms noted before and after ace wrap. Crutch education provided. Pt verbalized and demonstrated education to RN.

## 2021-04-09 NOTE — ED Provider Notes (Signed)
[facility] Emergency Department Provider Note  ____________________________________________   Event Date/Time   First MD Initiated Contact with Patient 04/09/21 2034     (approximate)  I have reviewed the triage vital signs and the nursing notes.   HISTORY  Chief Complaint Ankle Pain and Near Syncope    HPI Joel Haycraft. is a 25 y.o. male otherwise healthy comes in with concerns for right ankle pain when he was stepping out of his truck.  Patient reports that he stepped out of his truck and landed on his foot wrong.  He reports having severe, constant, nothing makes it better or worse pain patient reports feeling like he was going to pass out due to the pain.  Patient does report taking 1 5 mg Percocet 2:30pm.  Patient reports that since getting the fluids that he is feeling much better.  Denies any hit his head or any other injuries.          History reviewed. No pertinent past medical history.  There are no problems to display for this patient.   History reviewed. No pertinent surgical history.  Prior to Admission medications   Medication Sig Start Date End Date Taking? Authorizing Provider  cyclobenzaprine (FLEXERIL) 5 MG tablet Take 1-2 tablets 3 times daily as needed 04/16/20   Enid Derry, PA-C  ibuprofen (ADVIL) 600 MG tablet Take 1 tablet (600 mg total) by mouth every 6 (six) hours as needed. 04/16/20   Enid Derry, PA-C    Allergies Patient has no known allergies.  History reviewed. No pertinent family history.  Social History Social History   Tobacco Use   Smoking status: Every Day    Packs/day: 1.00    Types: Cigarettes   Smokeless tobacco: Never  Substance Use Topics   Alcohol use: No   Drug use: No      Review of Systems Constitutional: No fever/chills near-syncope Eyes: No visual changes. ENT: No sore throat. Cardiovascular: Denies chest pain. Respiratory: Denies shortness of  breath. Gastrointestinal: No abdominal pain.  No nausea, no vomiting.  No diarrhea.  No constipation. Genitourinary: Negative for dysuria. Musculoskeletal: Negative for back pain.  Ankle pain Skin: Negative for rash. Neurological: Negative for headaches, focal weakness or numbness. All other ROS negative ____________________________________________   PHYSICAL EXAM:  VITAL SIGNS: ED Triage Vitals  Enc Vitals Group     BP 04/09/21 1839 (!) 85/49     Pulse Rate 04/09/21 1839 61     Resp 04/09/21 1839 20     Temp 04/09/21 1839 98.2 F (36.8 C)     Temp Source 04/09/21 1839 Oral     SpO2 04/09/21 1839 99 %     Weight 04/09/21 1851 130 lb (59 kg)     Height 04/09/21 1851 5\' 8"  (1.727 m)     Head Circumference --      Peak Flow --      Pain Score 04/09/21 1851 7     Pain Loc --      Pain Edu? --      Excl. in GC? --     Constitutional: Alert and oriented. Well appearing and in no acute distress. Eyes: Conjunctivae are normal. EOMI. Head: Atraumatic. Nose: No congestion/rhinnorhea. Mouth/Throat: Mucous membranes are moist.   Neck: No stridor. Trachea Midline. FROM Cardiovascular: Normal rate, regular rhythm. Grossly normal heart sounds.  Good peripheral circulation. Respiratory: Normal respiratory effort.  No retractions. Lungs CTAB. Gastrointestinal: Soft and nontender. No distention. No abdominal bruits.  Musculoskeletal: Little bit of swelling noted on the dorsum of the foot with a little bit of midfoot tenderness.  Little bit of fibular tenderness as well.  Able to dorsiflex and plantarflex the ankle.  2+ distal pulse neurologic:  Normal speech and language. No gross focal neurologic deficits are appreciated.  Skin:  Skin is warm, dry and intact. No rash noted. Psychiatric: Mood and affect are normal. Speech and behavior are normal. GU: Deferred   ____________________________________________   LABS (all labs ordered are listed, but only abnormal results are  displayed)  Labs Reviewed  CBC - Abnormal; Notable for the following components:      Result Value   WBC 16.0 (*)    All other components within normal limits  BASIC METABOLIC PANEL  URINALYSIS, ROUTINE W REFLEX MICROSCOPIC  CBG MONITORING, ED   ____________________________________________   ED ECG REPORT I, Concha Se, the attending physician, personally viewed and interpreted this ECG.  Normal sinus rate of 63, no ST elevations, no T wave versions, normal intervals ____________________________________________  RADIOLOGY Vela Prose, personally viewed and evaluated these images (plain radiographs) as part of my medical decision making, as well as reviewing the written report by the radiologist.  ED MD interpretation: X-ray negative  Official radiology report(s): DG Tibia/Fibula Right  Result Date: 04/09/2021 CLINICAL DATA:  Fall and trauma to the right lower extremity. EXAM: RIGHT FOOT COMPLETE - 3+ VIEW; RIGHT TIBIA AND FIBULA - 2 VIEW COMPARISON:  Right ankle radiograph dated 04/09/2021. FINDINGS: There is no evidence of fracture or dislocation. There is no evidence of arthropathy or other focal bone abnormality. Soft tissues are unremarkable. IMPRESSION: Negative. Electronically Signed   By: Elgie Collard M.D.   On: 04/09/2021 21:16   DG Ankle Complete Right  Result Date: 04/09/2021 CLINICAL DATA:  Status post fall. Pt states that he landed on his R ankle when stepping out of the truck. While waiting on triage pt states he felt like he was going to pass out. Pt became pale and diaphoretic EXAM: RIGHT ANKLE - COMPLETE 3+ VIEW COMPARISON:  None. FINDINGS: There is no evidence of fracture, dislocation, or joint effusion. There is no evidence of arthropathy or other focal bone abnormality. Soft tissues are unremarkable. IMPRESSION: Negative. Electronically Signed   By: Tish Frederickson M.D.   On: 04/09/2021 19:39   DG Foot Complete Right  Result Date: 04/09/2021 CLINICAL  DATA:  Fall and trauma to the right lower extremity. EXAM: RIGHT FOOT COMPLETE - 3+ VIEW; RIGHT TIBIA AND FIBULA - 2 VIEW COMPARISON:  Right ankle radiograph dated 04/09/2021. FINDINGS: There is no evidence of fracture or dislocation. There is no evidence of arthropathy or other focal bone abnormality. Soft tissues are unremarkable. IMPRESSION: Negative. Electronically Signed   By: Elgie Collard M.D.   On: 04/09/2021 21:16    ____________________________________________   PROCEDURES  Procedure(s) performed (including Critical Care):  Procedures   ____________________________________________   INITIAL IMPRESSION / ASSESSMENT AND PLAN / ED COURSE  Joel Arizmendi. was evaluated in Emergency Department on 04/09/2021 for the symptoms described in the history of present illness. He was evaluated in the context of the global COVID-19 pandemic, which necessitated consideration that the patient might be at risk for infection with the SARS-CoV-2 virus that causes COVID-19. Institutional protocols and algorithms that pertain to the evaluation of patients at risk for COVID-19 are in a state of rapid change based on information released by regulatory bodies including the CDC and federal  and state organizations. These policies and algorithms were followed during the patient's care in the ED.     Ankle Most Likely DDx: Ankle sprain  DDx that was also considered d/t potential to cause harm, but was found less likely based on history and physical (as detailed above): -Ankle fracture- will use the [place] ankle rules to determine if xray is warranted -Unlikely Maisonneuve fracture but will get fib xray -Achilles tendon rupture, able to point toes down on their own  Suspect that patient had a vasovagal due to the pain.  Patient currently normotensive and feeling much better.  X-rays were negative.  Patient understands to Ace wrap, crutches, follow-up with orthopedic and ibuprofen Tylenol for  pain.  He expressed understanding but was comfortable with discharge  I discussed the provisional nature of ED diagnosis, the treatment so far, the ongoing plan of care, follow up appointments and return precautions with the patient and any family or support people present. They expressed understanding and agreed with the plan, discharged home.        ____________________________________________   FINAL CLINICAL IMPRESSION(S) / ED DIAGNOSES   Final diagnoses:  Sprain of right ankle, unspecified ligament, initial encounter      MEDICATIONS GIVEN DURING THIS VISIT:  Medications  sodium chloride 0.9 % bolus 500 mL (500 mLs Intravenous New Bag/Given 04/09/21 1901)     ED Discharge Orders     None        Note:  This document was prepared using Dragon voice recognition software and may include unintentional dictation errors.    Concha Se, MD 04/09/21 2236

## 2021-04-09 NOTE — ED Triage Notes (Addendum)
Pt via POV from home. Pt states that he landed on his R ankle when stepping out of the truck. While waiting on triage pt states he felt like he was going to pass out. Pt became pale and diaphoretic.    Pt took 1 5mg  Percocet at 02:30pm

## 2021-04-09 NOTE — ED Provider Notes (Signed)
Emergency Medicine Provider Triage Evaluation Note  Joel Floyd. , a 25 y.o. male  was evaluated in triage.  Pt complains of right ankle pain after missing a step and inverting his ankle near his vehicle.  Patient has not been able to ambulate since injury occurred and states that pain has caused him to feel like he is going to pass out.  He denies chest pain, chest tightness or abdominal pain.  No abrasions or lacerations.  Review of Systems  Positive: Patient has ankle pain.  Negative: No chest pain or abdominal pain.   Physical Exam  BP (!) 85/49 (BP Location: Right Arm)   Pulse 61   Temp 98.2 F (36.8 C) (Oral)   Resp 20   SpO2 99%  Gen:   Awake, no distress   Resp:  Normal effort  MSK:   Moves extremities without difficulty  Other:    Medical Decision Making  Medically screening exam initiated at 6:47 PM.  Appropriate orders placed.  Joel Floyd. was informed that the remainder of the evaluation will be completed by another provider, this initial triage assessment does not replace that evaluation, and the importance of remaining in the ED until their evaluation is complete.     Pia Mau [place], PA-C 04/09/21 1848    Gilles Chiquito, MD 04/09/21 Ernestina [place]

## 2021-04-09 NOTE — Discharge Instructions (Signed)
We have wrapped it and you should stay off of it and follow-up with orthopedics if you are continuing to have significant pain.  He can take Tylenol 1 g every 8 hours and ibuprofen 600-800 every 6-8 hours with food.

## 2022-04-15 ENCOUNTER — Encounter: Payer: Self-pay | Admitting: Emergency Medicine

## 2022-04-15 ENCOUNTER — Emergency Department
Admission: EM | Admit: 2022-04-15 | Discharge: 2022-04-15 | Disposition: A | Discharge status: Home / Self Care | Payer: Self-pay | Attending: Emergency Medicine | Admitting: Emergency Medicine

## 2022-04-15 ENCOUNTER — Other Ambulatory Visit: Payer: Self-pay

## 2022-04-15 DIAGNOSIS — K529 Noninfective gastroenteritis and colitis, unspecified: Secondary | ICD-10-CM

## 2022-04-15 MED ORDER — ONDANSETRON 4 MG PO TBDP
4.0000 mg | ORAL_TABLET | Freq: Once | ORAL | Status: AC
  Administered 2022-04-15: 4 mg via ORAL
  Filled 2022-04-15: qty 1

## 2022-04-15 MED ORDER — ONDANSETRON 4 MG PO TBDP: 4.0000 mg | ORAL_TABLET | Freq: Three times a day (TID) | ORAL | 0 refills | Status: AC | PRN

## 2022-04-15 NOTE — ED Provider Notes (Signed)
   [facility] Provider Note    Event Date/Time   First MD Initiated Contact with Patient 04/15/22 1030     (approximate)   History   Emesis   HPI  Joel Floyd. is a 26 y.o. male with no significant past medical history presents today with complaints of nausea vomiting diarrhea that started this morning approximately 5 AM.  He denies abdominal pain.  No sick contacts reported.  No fevers.     Physical Exam   Triage Vital Signs: ED Triage Vitals  Enc Vitals Group     BP 04/15/22 1031 124/78     Pulse Rate 04/15/22 1031 82     Resp 04/15/22 1031 18     Temp 04/15/22 1031 (!) 97.4 F (36.3 C)     Temp Source 04/15/22 1031 Oral     SpO2 04/15/22 1031 99 %     Weight 04/15/22 1032 59 kg (130 lb)     Height 04/15/22 1032 1.727 m (5\' 8" )     Head Circumference --      Peak Flow --      Pain Score 04/15/22 1034 0     Pain Loc --      Pain Edu? --      Excl. in GC? --     Most recent vital signs: Vitals:   04/15/22 1031  BP: 124/78  Pulse: 82  Resp: 18  Temp: (!) 97.4 F (36.3 C)  SpO2: 99%     General: Awake, no distress.  CV:  Good peripheral perfusion.  Resp:  Normal effort.  Abd:  No distention.  Soft, nontender Other:     ED Results / Procedures / Treatments   Labs (all labs ordered are listed, but only abnormal results are displayed) Labs Reviewed - No data to display   EKG     RADIOLOGY     PROCEDURES:  Critical Care performed:   Procedures   MEDICATIONS ORDERED IN ED: Medications  ondansetron (ZOFRAN-ODT) disintegrating tablet 4 mg (4 mg Oral Given 04/15/22 1056)     IMPRESSION / MDM / ASSESSMENT AND PLAN / ED COURSE  I reviewed the triage vital signs and the nursing notes. Patient's presentation is most consistent with acute illness / injury with system symptoms.   Patient presents with nausea vomiting diarrhea as detailed above.  Differential includes viral gastroenteritis, colitis.   Abdominal exam is reassuring and the patient is well-appearing with normal vitals.  Viral gastroenteritis is prevalent in the community at this time, will treat supportively with ODT Zofran, outpatient follow-up.  Return precautions discussed.       FINAL CLINICAL IMPRESSION(S) / ED DIAGNOSES   Final diagnoses:  Gastroenteritis     Rx / DC Orders   ED Discharge Orders          Ordered    ondansetron (ZOFRAN-ODT) 4 MG disintegrating tablet  Every 8 hours PRN        04/15/22 1040             Note:  This document was prepared using Dragon voice recognition software and may include unintentional dictation errors.   04/17/22, MD 04/15/22 1210

## 2022-04-15 NOTE — ED Triage Notes (Signed)
Presents with family  States he developed on n/v/d around 530 this morning

## 2023-04-02 IMAGING — CR DG ANKLE COMPLETE 3+V*R*
1 series · 3 of 3 positions shown · non-contrast
Comparison: None.

CLINICAL DATA: Status post fall. Pt states that he landed on his R
ankle when stepping out of the truck. While waiting on triage pt
states he felt like he was going to pass out. Pt became pale and
diaphoretic

EXAM:
RIGHT ANKLE - COMPLETE 3+ VIEW

[Series 1: dg ankle complete right · 0.14mm/px · 3 of 3 slices shown]
[im 1/3]
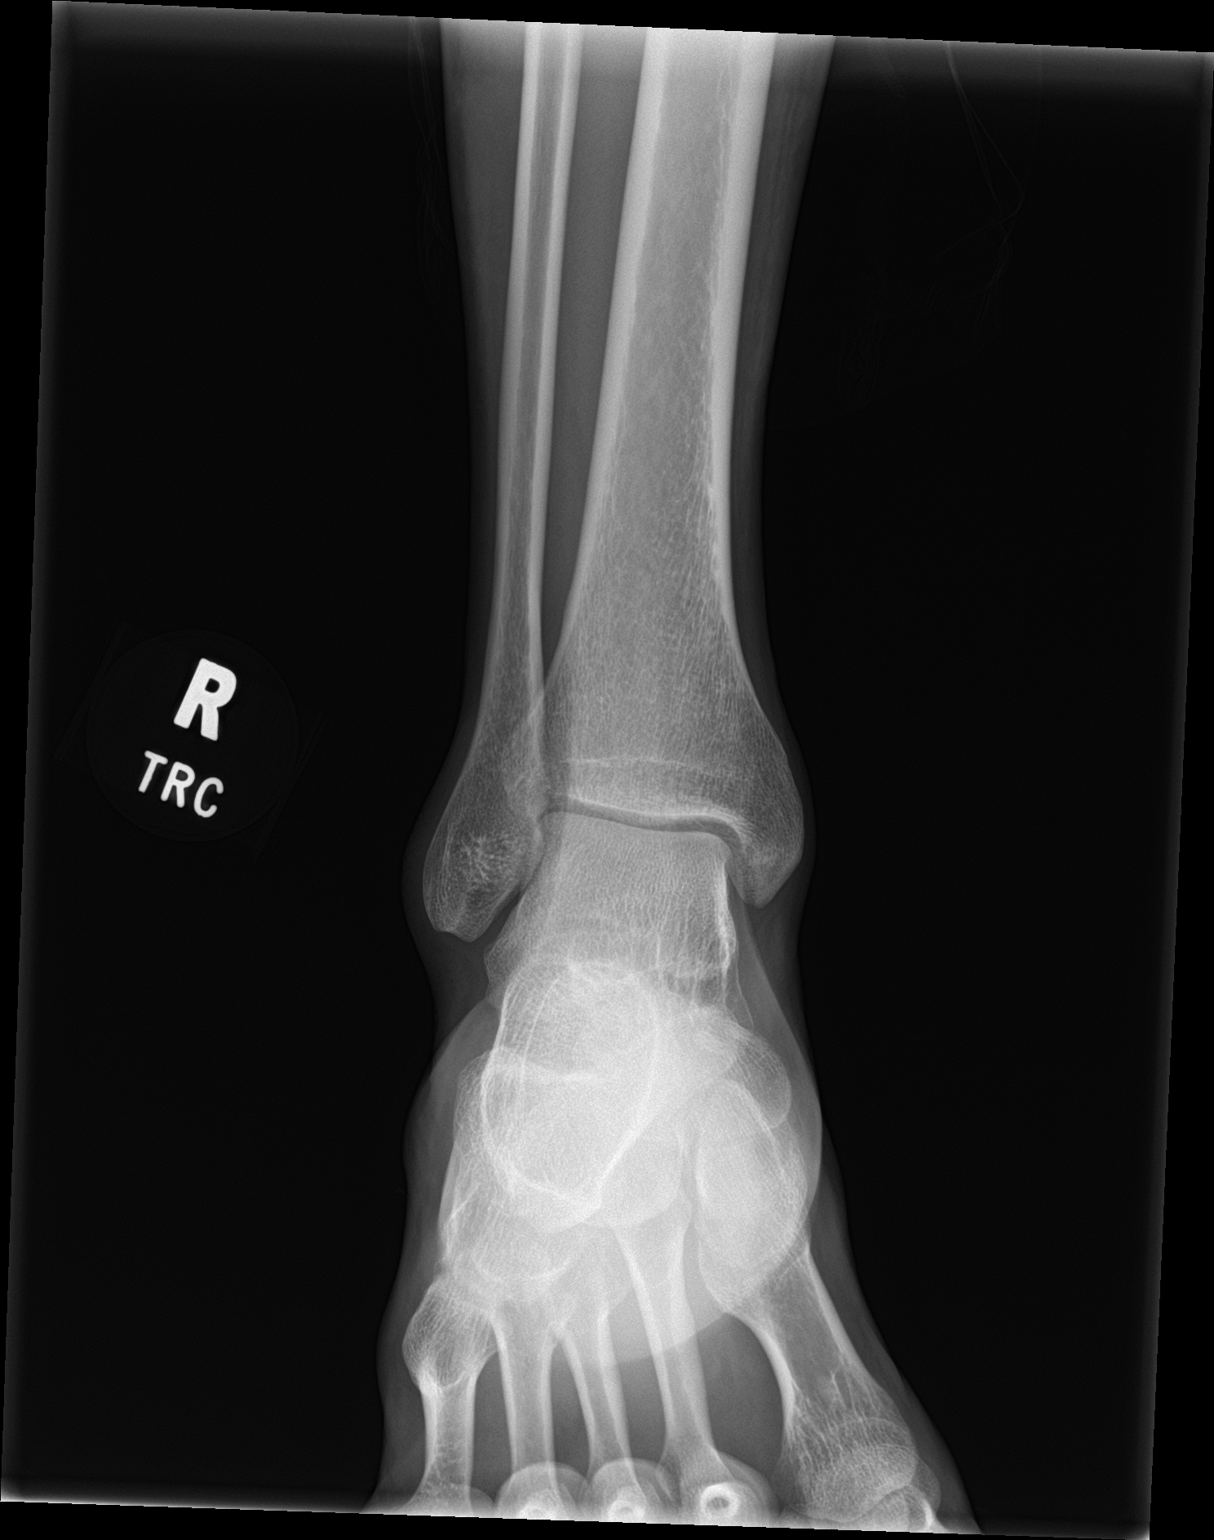
[im 2/3]
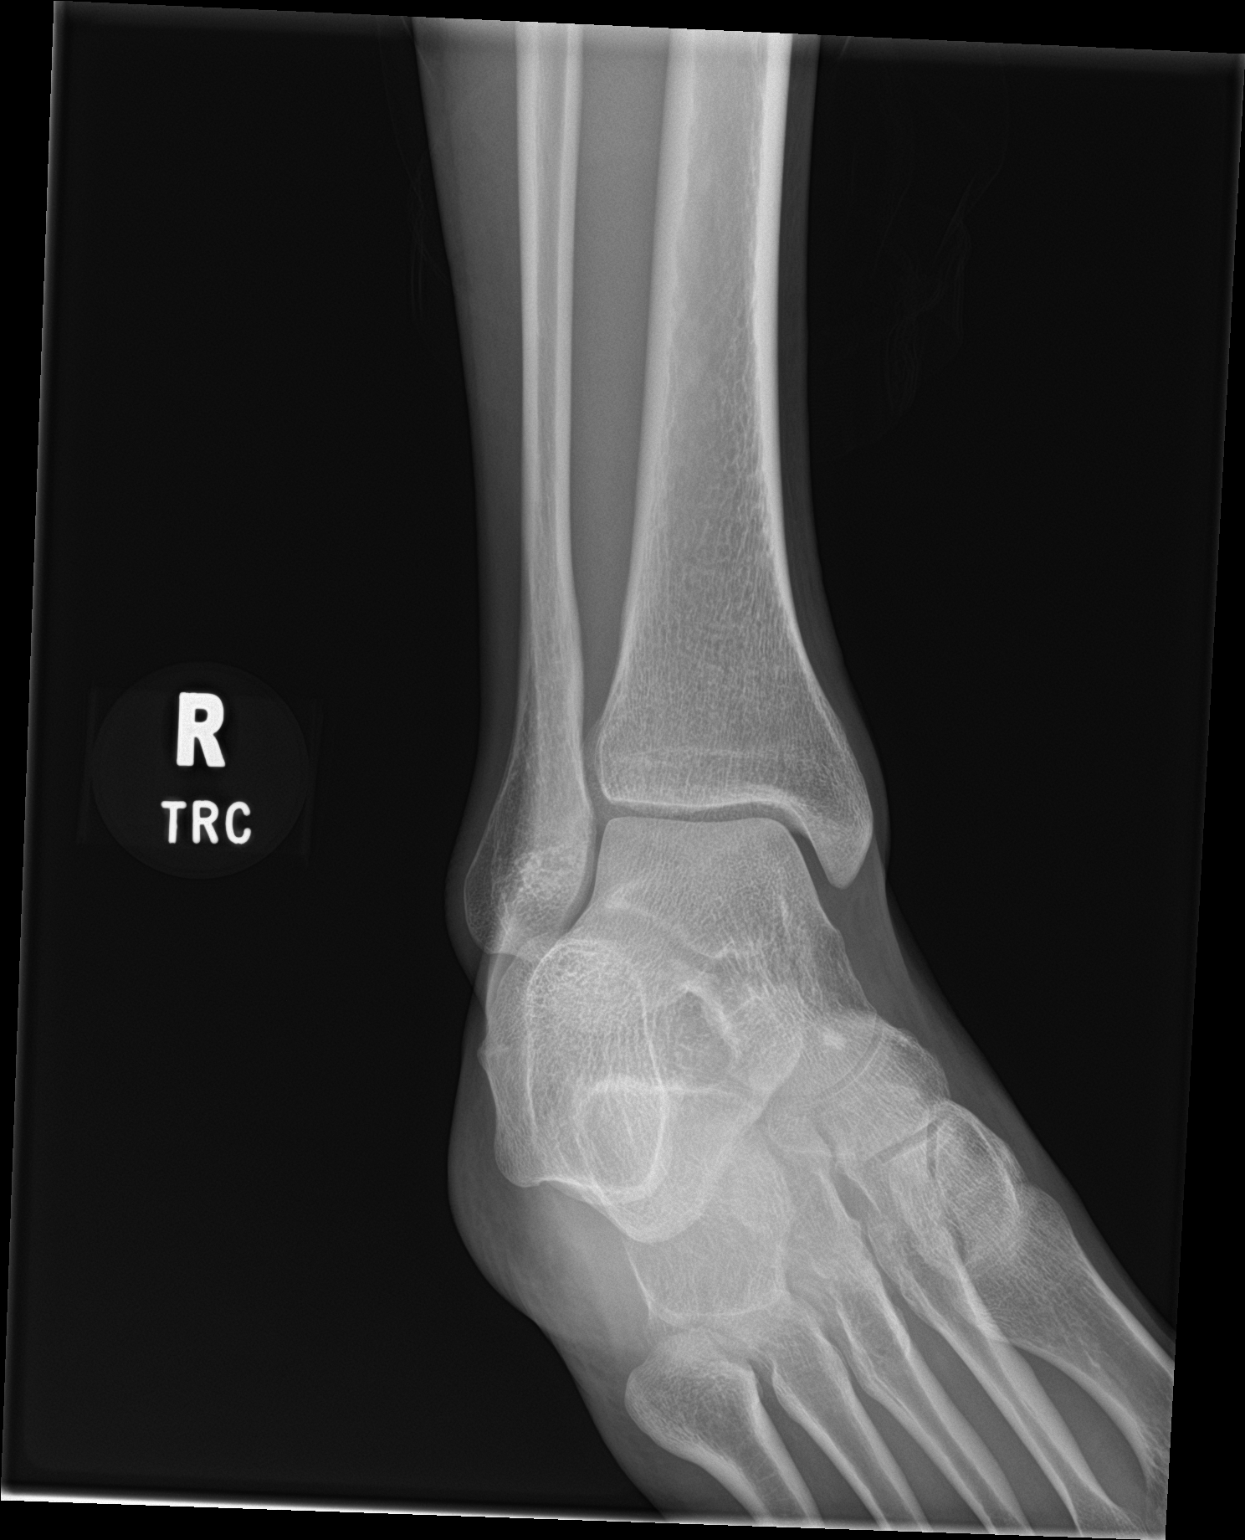
[im 3/3]
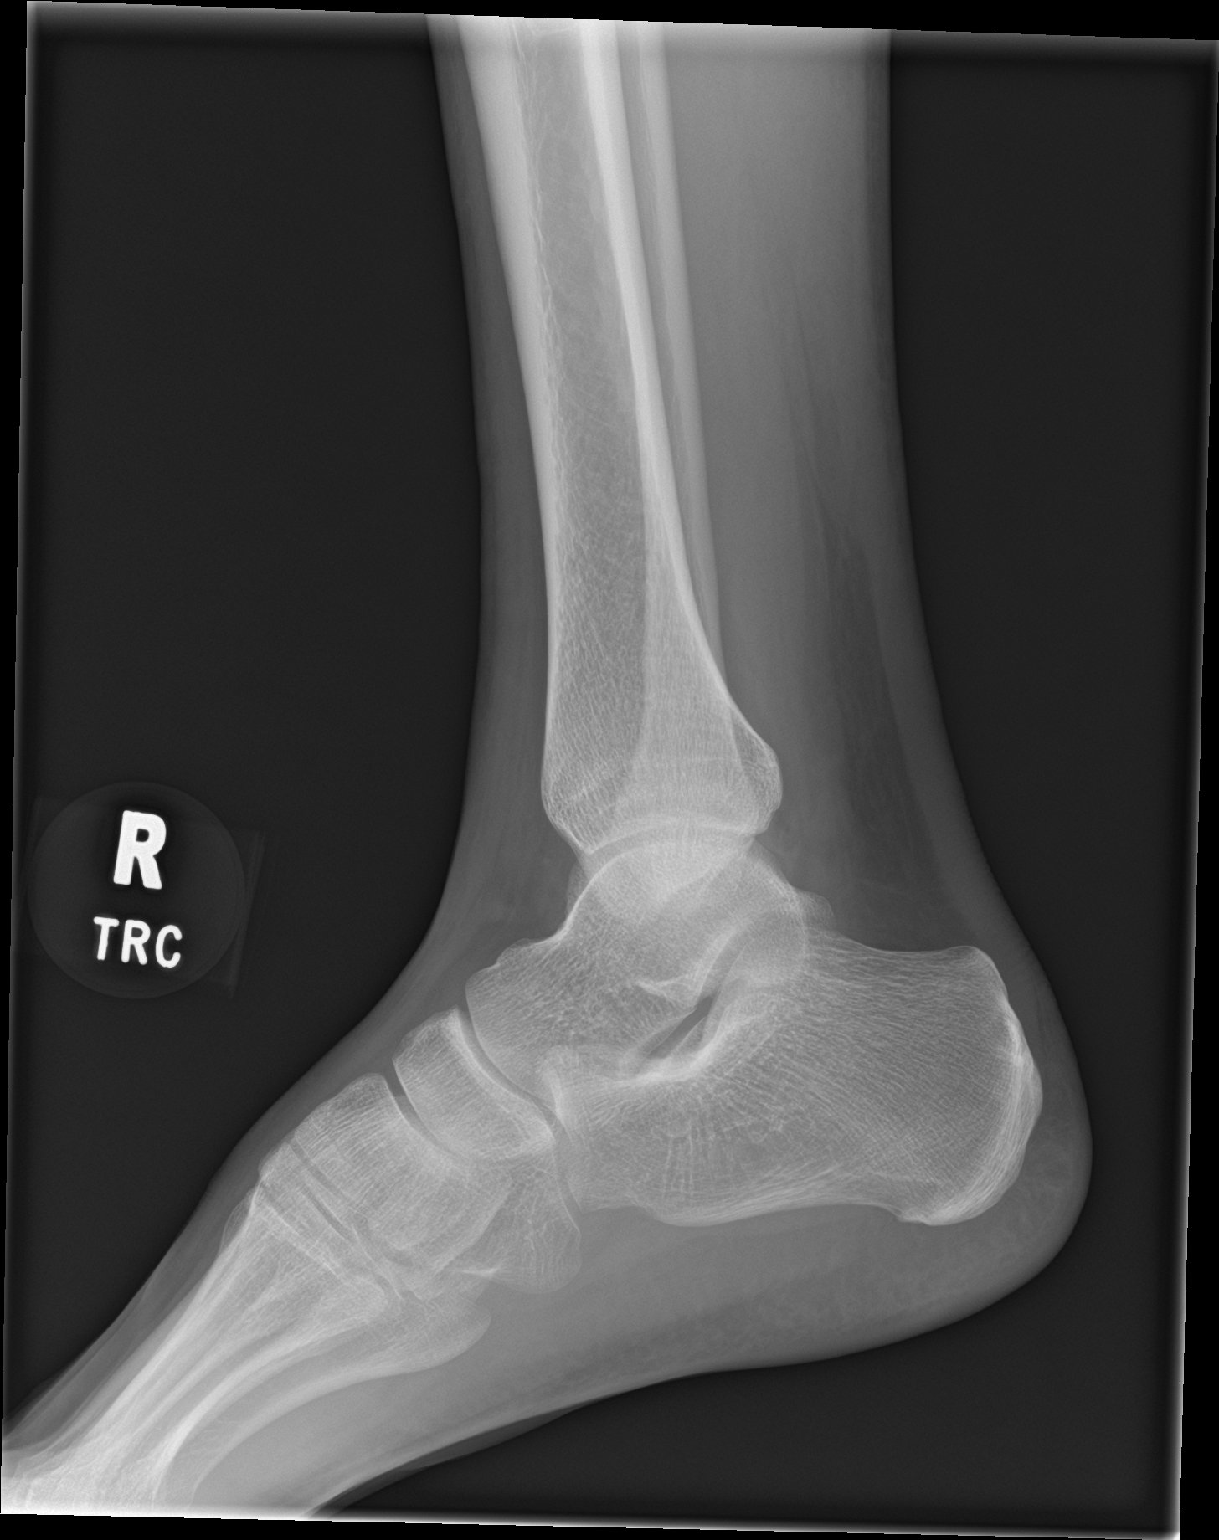

[3 of 3 positions shown; findings below may reference images not displayed]

FINDINGS: There is no evidence of fracture, dislocation, or joint effusion.
There is no evidence of arthropathy or other focal bone abnormality.
Soft tissues are unremarkable.
IMPRESSION: Negative.
